# Patient Record
Sex: Male | Born: 1980 | Race: Black or African American | Hispanic: No | Marital: Single | Smoking: Current every day smoker
Health system: Southern US, Community
[De-identification: ages and names within clinical notes are randomized; demographics above are authoritative.]

---

## 2002-12-09 ENCOUNTER — Emergency Department (HOSPITAL_COMMUNITY): Admission: EM | Admit: 2002-12-09 | Discharge: 2002-12-09 | Payer: Self-pay | Admitting: Emergency Medicine

## 2006-05-07 ENCOUNTER — Emergency Department (HOSPITAL_COMMUNITY): Admission: EM | Admit: 2006-05-07 | Discharge: 2006-05-07 | Payer: Self-pay | Admitting: Family Medicine

## 2007-05-19 ENCOUNTER — Emergency Department (HOSPITAL_COMMUNITY): Admission: EM | Admit: 2007-05-19 | Discharge: 2007-05-19 | Payer: Self-pay | Admitting: Emergency Medicine

## 2007-10-17 ENCOUNTER — Emergency Department (HOSPITAL_COMMUNITY): Admission: EM | Admit: 2007-10-17 | Discharge: 2007-10-17 | Payer: Self-pay | Admitting: Emergency Medicine

## 2009-05-12 ENCOUNTER — Emergency Department (HOSPITAL_COMMUNITY): Admission: EM | Admit: 2009-05-12 | Discharge: 2009-05-12 | Payer: Self-pay | Admitting: Emergency Medicine

## 2009-10-11 ENCOUNTER — Emergency Department (HOSPITAL_COMMUNITY): Admission: EM | Admit: 2009-10-11 | Discharge: 2009-10-11 | Payer: Self-pay | Admitting: Emergency Medicine

## 2009-12-07 ENCOUNTER — Emergency Department (HOSPITAL_COMMUNITY): Admission: EM | Admit: 2009-12-07 | Discharge: 2009-12-07 | Payer: Self-pay | Admitting: Emergency Medicine

## 2011-08-16 ENCOUNTER — Encounter (HOSPITAL_COMMUNITY): Payer: Self-pay

## 2011-08-16 ENCOUNTER — Emergency Department (HOSPITAL_COMMUNITY)
Admission: EM | Admit: 2011-08-16 | Discharge: 2011-08-16 | Disposition: A | Discharge status: Home / Self Care | Payer: Self-pay | Attending: Emergency Medicine | Admitting: Emergency Medicine

## 2011-08-16 DIAGNOSIS — F17200 Nicotine dependence, unspecified, uncomplicated: Secondary | ICD-10-CM | POA: Insufficient documentation

## 2011-08-16 DIAGNOSIS — K0889 Other specified disorders of teeth and supporting structures: Secondary | ICD-10-CM

## 2011-08-16 DIAGNOSIS — K029 Dental caries, unspecified: Secondary | ICD-10-CM | POA: Insufficient documentation

## 2011-08-16 MED ORDER — HYDROCODONE-ACETAMINOPHEN 5-325 MG PO TABS: 2.0000 | ORAL_TABLET | ORAL | Status: AC | PRN

## 2011-08-16 MED ORDER — AMOXICILLIN 500 MG PO CAPS: 500.0000 mg | ORAL_CAPSULE | Freq: Three times a day (TID) | ORAL | Status: AC

## 2011-08-16 NOTE — ED Provider Notes (Signed)
History     CSN: [id number]  Arrival date & time 08/16/11  1203   First MD Initiated Contact with Patient 08/16/11 1222      Chief Complaint  Patient presents with  . Dental Pain    (Consider location/radiation/quality/duration/timing/severity/associated sxs/prior treatment) Patient is a 31 y.o. male presenting with tooth pain. The history is provided by the patient.  Dental PainThe primary symptoms include mouth pain. Primary symptoms do not include shortness of breath or cough. The symptoms began more than 1 week ago. The symptoms are worsening. The symptoms occur frequently.  Additional symptoms include: gum swelling and jaw pain. Additional symptoms do not include: nosebleeds. Medical issues include: smoking.    History reviewed. No pertinent past medical history.  History reviewed. No pertinent past surgical history.  No family history on file.  History  Substance Use Topics  . Smoking status: Current Everyday Smoker  . Smokeless tobacco: Not on file  . Alcohol Use: No      Review of Systems  Constitutional: Negative for activity change.       All ROS Neg except as noted in HPI  HENT: Positive for dental problem. Negative for nosebleeds and neck pain.   Eyes: Negative for photophobia and discharge.  Respiratory: Negative for cough, shortness of breath and wheezing.   Cardiovascular: Negative for chest pain and palpitations.  Gastrointestinal: Negative for abdominal pain and blood in stool.  Genitourinary: Negative for dysuria, frequency and hematuria.  Musculoskeletal: Negative for back pain and arthralgias.  Skin: Negative.   Neurological: Negative for dizziness, seizures and speech difficulty.  Psychiatric/Behavioral: Negative for hallucinations and confusion.    Allergies  Review of patient's allergies indicates no known allergies.  Home Medications   Current Outpatient Rx  Name Route Sig Dispense Refill  . AMOXICILLIN 500 MG PO CAPS Oral Take 1  capsule (500 mg total) by mouth 3 (three) times daily. 21 capsule 0  . HYDROCODONE-ACETAMINOPHEN 5-325 MG PO TABS Oral Take 2 tablets by mouth every 4 (four) hours as needed for pain. 20 tablet 0    BP 127/93  Pulse 88  Temp 98.4 F (36.9 C) (Oral)  Resp 18  SpO2 100%  Physical Exam  Nursing note and vitals reviewed. Constitutional: He is oriented to person, place, and time. He appears well-developed and well-nourished.  Non-toxic appearance.  HENT:  Head: Normocephalic.  Right Ear: Tympanic membrane and external ear normal.  Left Ear: Tympanic membrane and external ear normal.       Patient has multiple dental caries. Several teeth are decayed to the gum line. There is swelling of the right lower gum area. No visible abscess.  Eyes: EOM and lids are normal. Pupils are equal, round, and reactive to light.  Neck: Normal range of motion. Neck supple. Carotid bruit is not present.  Cardiovascular: Normal rate, regular rhythm, normal heart sounds, intact distal pulses and normal pulses.   Pulmonary/Chest: Breath sounds normal. No respiratory distress.  Abdominal: Soft. Bowel sounds are normal. There is no tenderness. There is no guarding.  Musculoskeletal: Normal range of motion.  Lymphadenopathy:       Head (right side): No submandibular adenopathy present.       Head (left side): No submandibular adenopathy present.    He has no cervical adenopathy.  Neurological: He is alert and oriented to person, place, and time. He has normal strength. No cranial nerve deficit or sensory deficit.  Skin: Skin is warm and dry.  Psychiatric: He has a normal  mood and affect. His speech is normal.    ED Course  Procedures (including critical care time)  Labs Reviewed - No data to display No results found.   1. Toothache       MDM  I have reviewed nursing notes, vital signs, and all appropriate lab and imaging results for this patient. The patient has multiple dental caries present on  examination. Vital signs are stable at this time. Dental resources sheet given to the patient. Prescription for Amoxil 500 mg 3 times daily, and Norco 5 mg #20 tablets given to the patient.       Kathie Dike, [place] 08/16/11 1244

## 2011-08-16 NOTE — ED Notes (Signed)
Pt reports pain to his lower rt tooth.  Pt reports that the tooth "broke off" a week ago.  Pt reports severe pain to the tooth.  Pt reports taking ibuprofen w/out relief.

## 2011-08-16 NOTE — Discharge Instructions (Signed)
Ear exam reveals multiple sites an infected cavity areas. Please see a dentist as well as possible to prevent further infection. Use Amoxil 3 times daily with food. Please use 3 ibuprofen tablets 3 times daily. Please use Norco every 4 hours if needed for pain. This medication may cause drowsiness, please use with caution.Toothache Toothaches are usually caused by tooth decay (cavity). However, other causes of toothache include:  Gum disease.   Cracked tooth.   Cracked filling.   Injury.   Jaw problem (temporo mandibular joint or TMJ disorder).   Tooth abscess.   Root sensitivity.   Grinding.   Eruption problems.  Swelling and redness around a painful tooth often means you have a dental abscess. Pain medicine and antibiotics can help reduce symptoms, but you will need to see a dentist within the next few days to have your problem properly evaluated and treated. If tooth decay is the problem, you may need a filling or root canal to save your tooth. If the problem is more severe, your tooth may need to be pulled. SEEK IMMEDIATE MEDICAL CARE IF:  You cannot swallow.   You develop severe swelling, increased redness, or increased pain in your mouth or face.   You have a fever.   You cannot open your mouth adequately.  Document Released: 03/17/2004 Document Revised: 01/27/2011 Document Reviewed: 05/07/2009 [facility] Patient Information 2012 [place], [place].

## 2011-08-17 NOTE — ED Provider Notes (Signed)
Medical screening examination/treatment/procedure(s) were performed by non-physician practitioner and as supervising physician I was immediately available for consultation/collaboration.   Benny Lennert, MD 08/17/11 [phone]

## 2012-08-18 ENCOUNTER — Encounter (HOSPITAL_COMMUNITY): Payer: Self-pay | Admitting: *Deleted

## 2012-08-18 ENCOUNTER — Emergency Department (HOSPITAL_COMMUNITY)
Admission: EM | Admit: 2012-08-18 | Discharge: 2012-08-18 | Disposition: A | Discharge status: Home / Self Care | Payer: Self-pay | Attending: Emergency Medicine | Admitting: Emergency Medicine

## 2012-08-18 ENCOUNTER — Emergency Department (HOSPITAL_COMMUNITY): Payer: Self-pay

## 2012-08-18 DIAGNOSIS — J40 Bronchitis, not specified as acute or chronic: Secondary | ICD-10-CM | POA: Insufficient documentation

## 2012-08-18 DIAGNOSIS — R630 Anorexia: Secondary | ICD-10-CM | POA: Insufficient documentation

## 2012-08-18 DIAGNOSIS — Z87891 Personal history of nicotine dependence: Secondary | ICD-10-CM | POA: Insufficient documentation

## 2012-08-18 MED ORDER — ALBUTEROL SULFATE HFA 108 (90 BASE) MCG/ACT IN AERS
2.0000 | INHALATION_SPRAY | Freq: Once | RESPIRATORY_TRACT | Status: AC
  Administered 2012-08-18: 2 via RESPIRATORY_TRACT
  Filled 2012-08-18: qty 6.7

## 2012-08-18 MED ORDER — AEROCHAMBER PLUS W/MASK MISC
1.0000 | Freq: Once | Status: AC
  Administered 2012-08-18: 1

## 2012-08-18 MED ORDER — BENZONATATE 100 MG PO CAPS: 100.0000 mg | ORAL_CAPSULE | Freq: Three times a day (TID) | ORAL | Status: DC

## 2012-08-18 NOTE — ED Provider Notes (Signed)
History    This chart was scribed for Corey Roller, MD by Leone Payor, ED Scribe. This patient was seen in room [facility]14[facility] and the patient's care was started 2:05 PM.  CSN: [id number] Arrival date & time 08/18/12  1309  First MD Initiated Contact with Patient 08/18/12 1401     Chief Complaint  Patient presents with  . Cough    The history is provided by the patient. No language interpreter was used.    HPI Comments: Corey Snow is a 32 y.o. male who presents to the Emergency Department complaining of an ongoing, constant, unchanged cough that started 4 days ago. States initially he had a productive cough but has become dry. He reports coughing constantly all throughout the day but is worse at night. He has taken Nyquil without relief. States he has had a decreased appetite. Denies drainage, blurred vision, HA, ear pain, fever, chills, leg swelling, diarrhea, abdominal pain, dysuria. Denies h/o asthma, heart or lung troubles.   History reviewed. No pertinent past medical history. History reviewed. No pertinent past surgical history. No family history on file. History  Substance Use Topics  . Smoking status: Former Games developer  . Smokeless tobacco: Not on file  . Alcohol Use: No    Review of Systems  Allergies  Review of patient's allergies indicates no known allergies.  Home Medications   Current Outpatient Rx  Name  Route  Sig  Dispense  Refill  . Doxylamine-DM (VICKS NYQUIL COUGH) 6.25-15 MG/15ML LIQD   Oral   Take 30 mLs by mouth every 4 (four) hours as needed (cough).         . benzonatate (TESSALON) 100 MG capsule   Oral   Take 1 capsule (100 mg total) by mouth every 8 (eight) hours.   21 capsule   0    BP 124/88  Pulse 86  Temp(Src) 98.1 F (36.7 C) (Oral)  Resp 18  Ht 5\' 7"  (1.702 m)  Wt 210 lb (95.255 kg)  BMI 32.88 kg/m2  SpO2 100% Physical Exam  Nursing note and vitals reviewed. Constitutional: He is oriented to person, place, and time. He  appears well-developed and well-nourished. No distress.  No distress and speaks in full sentences.   HENT:  Head: Normocephalic and atraumatic.  Eyes: Conjunctivae and EOM are normal. Pupils are equal, round, and reactive to light.  Neck: Normal range of motion. Neck supple.  Cardiovascular: Normal rate, regular rhythm, normal heart sounds and intact distal pulses.   No murmur heard. Pulmonary/Chest: Effort normal and breath sounds normal. No respiratory distress. He has no wheezes. He has no rales. He exhibits no tenderness.  Abdominal: Soft. Bowel sounds are normal. He exhibits no distension and no mass. There is no tenderness. There is no rebound and no guarding.  Musculoskeletal: Normal range of motion. He exhibits no tenderness.  Lymphadenopathy:    He has no cervical adenopathy.  Neurological: He is alert and oriented to person, place, and time.  Skin: Skin is warm and dry.  Psychiatric: He has a normal mood and affect.    ED Course  Procedures (including critical care time)  DIAGNOSTIC STUDIES: Oxygen Saturation is 100% on RA, normal by my interpretation.    COORDINATION OF CARE: 2:04 PM Discussed treatment plan with pt at bedside and pt agreed to plan.   Labs Reviewed - No data to display Dg Chest 2 View  08/18/2012   *RADIOLOGY REPORT*  Clinical Data: Cough.  CHEST - 2 VIEW  Comparison: 10/17/2007.  Findings: The heart, mediastinum and hilar contours are normal. The lungs are clear and fully expanded.  There are no effusions or pneumothoraces.  There are no acute bony changes.  There is mild S- shaped scoliosis of the thoracic spine.  IMPRESSION: No active disease.   Original Report Authenticated By: Sander Radon, M.D.   1. Bronchitis     MDM  Pt is well appearing, normal VS, normal exam, likely mild bronchitis - tessalong, MDI, stable for d/c.  Meds given in ED:  Medications  albuterol (PROVENTIL HFA;VENTOLIN HFA) 108 (90 BASE) MCG/ACT inhaler 2 puff (not  administered)  aerochamber plus with mask device 1 each (not administered)    New Prescriptions   BENZONATATE (TESSALON) 100 MG CAPSULE    Take 1 capsule (100 mg total) by mouth every 8 (eight) hours.      I personally performed the services described in this documentation, which was scribed in my presence. The recorded information has been reviewed and is accurate.      Corey Roller, MD 08/18/12 [phone]

## 2012-08-18 NOTE — ED Notes (Signed)
Reviewed instructions with pt. 1 script given to pt. Voiced understanding. nad noted prior to dc. resp even/nonlabored.

## 2012-08-18 NOTE — ED Notes (Signed)
Pt c/o cough that is non productive, mid center chest pain that is worse with palpation and respiration that started three days ago, denies any n/v, fever.

## 2017-01-21 ENCOUNTER — Emergency Department (HOSPITAL_COMMUNITY): Payer: Self-pay

## 2017-01-21 ENCOUNTER — Other Ambulatory Visit: Payer: Self-pay

## 2017-01-21 ENCOUNTER — Encounter (HOSPITAL_COMMUNITY): Payer: Self-pay | Admitting: Nurse Practitioner

## 2017-01-21 ENCOUNTER — Emergency Department (HOSPITAL_COMMUNITY)
Admission: EM | Admit: 2017-01-21 | Discharge: 2017-01-21 | Disposition: A | Discharge status: Home / Self Care | Payer: Self-pay | Attending: Emergency Medicine | Admitting: Emergency Medicine

## 2017-01-21 DIAGNOSIS — Y9389 Activity, other specified: Secondary | ICD-10-CM | POA: Insufficient documentation

## 2017-01-21 DIAGNOSIS — Z87891 Personal history of nicotine dependence: Secondary | ICD-10-CM | POA: Insufficient documentation

## 2017-01-21 DIAGNOSIS — X58XXXA Exposure to other specified factors, initial encounter: Secondary | ICD-10-CM | POA: Insufficient documentation

## 2017-01-21 DIAGNOSIS — Y929 Unspecified place or not applicable: Secondary | ICD-10-CM | POA: Insufficient documentation

## 2017-01-21 DIAGNOSIS — M6789 Other specified disorders of synovium and tendon, multiple sites: Secondary | ICD-10-CM | POA: Insufficient documentation

## 2017-01-21 DIAGNOSIS — Y999 Unspecified external cause status: Secondary | ICD-10-CM | POA: Insufficient documentation

## 2017-01-21 DIAGNOSIS — S62607A Fracture of unspecified phalanx of left little finger, initial encounter for closed fracture: Secondary | ICD-10-CM | POA: Insufficient documentation

## 2017-01-21 DIAGNOSIS — Z79899 Other long term (current) drug therapy: Secondary | ICD-10-CM | POA: Insufficient documentation

## 2017-01-21 MED ORDER — HYDROCODONE-ACETAMINOPHEN 5-325 MG PO TABS
1.0000 | ORAL_TABLET | Freq: Once | ORAL | Status: AC
  Administered 2017-01-21: 1 via ORAL
  Filled 2017-01-21: qty 1

## 2017-01-21 MED ORDER — NAPROXEN 500 MG PO TABS: 500.0000 mg | ORAL_TABLET | Freq: Two times a day (BID) | ORAL | 0 refills | Status: DC

## 2017-01-21 NOTE — ED Triage Notes (Signed)
Patient reports hitting left pinky the day before Thanksgiving. Reports taking ibuprofen for the pain and discomfort however, it has become harder to move pinky over the past week and has noticed an increase in swelling. Denies any other pain or discomfort.

## 2017-01-21 NOTE — ED Provider Notes (Signed)
[facility]-EMERGENCY DEPT Provider Note   CSN: [id number] Arrival date & time: 01/21/17  1147     History   Chief Complaint Chief Complaint  Patient presents with  . Finger Injury    HPI Corey CrouchWilliam J Snow is a 36 y.o. male who presents to the ED with finger pain. The pain is located in the left little finger. Patient reports that he hit his pinky finger the day before Thanksgiving and it has continue to hurt and swell. The pain has increased over the past week patient denies any other problems.   HPI  History reviewed. No pertinent past medical history.  There are no active problems to display for this patient.   History reviewed. No pertinent surgical history.     Home Medications    Prior to Admission medications   Medication Sig Start Date End Date Taking? Authorizing Provider  benzonatate (TESSALON) 100 MG capsule Take 1 capsule (100 mg total) by mouth every 8 (eight) hours. 08/18/12   Eber HongMiller, Brian, MD  Doxylamine-DM (VICKS NYQUIL COUGH) 6.25-15 MG/15ML LIQD Take 30 mLs by mouth every 4 (four) hours as needed (cough).    [provider]  naproxen (NAPROSYN) 500 MG tablet Take 1 tablet (500 mg total) by mouth 2 (two) times daily. 01/21/17   Janne NapoleonNeese, Lakenzie Mcclafferty M, NP    Family History History reviewed. No pertinent family history.  Social History Social History   Tobacco Use  . Smoking status: Former Games developermoker  . Smokeless tobacco: Never Used  Substance Use Topics  . Alcohol use: No  . Drug use: No     Allergies   Patient has no known allergies.   Review of Systems Review of Systems  Musculoskeletal: Positive for arthralgias.  Skin: Negative for wound.  All other systems reviewed and are negative.    Physical Exam Updated Vital Signs BP (!) 147/99 (BP Location: Right Arm)   Pulse 77   Temp 98.4 F (36.9 C) (Oral)   Resp 14   Ht 5\' 7"  (1.702 m)   Wt 97.5 kg (215 lb)   SpO2 99%   BMI 33.67 kg/m   Physical Exam    Constitutional: He appears well-developed and well-nourished. No distress.  HENT:  Head: Normocephalic.  Eyes: EOM are normal.  Neck: Neck supple.  Cardiovascular: Normal rate.  Pulmonary/Chest: Effort normal.  Musculoskeletal:       Left hand: He exhibits decreased range of motion, tenderness and deformity. He exhibits normal capillary refill and no laceration. Swelling: minimal. Decreased strength (little finger) noted. He exhibits no thumb/finger opposition.  The left little finger is slightly swollen and tender with passive range of motion. Patient is unable to extend the little finger and there is decreased strength.   Neurological: He is alert.  Skin: Skin is warm and dry.  Psychiatric: He has a normal mood and affect. His behavior is normal.  Nursing note and vitals reviewed.    ED Treatments / Results  Labs (all labs ordered are listed, but only abnormal results are displayed) Labs Reviewed - No data to display  Radiology Dg Finger Little Left  Result Date: 01/21/2017 CLINICAL DATA:  Injury, persistent pain EXAM: LEFT LITTLE FINGER 2+V COMPARISON:  None. FINDINGS: There an acute displaced and angulated fracture of the left fifth metacarpal just proximal to the fifth MTP joint. Mild overlying soft tissue swelling. No associated subluxation or dislocation. Fifth metacarpal fracture does not involve the articular surface. IMPRESSION: Acute minimally displaced and angulated left fifth  distal metacarpal fracture. Electronically Signed   By: Judie PetitM.  Shick M.D.   On: 01/21/2017 13:33    Procedures Procedures (including critical care time)  Medications Ordered in ED Medications  HYDROcodone-acetaminophen (NORCO/VICODIN) 5-325 MG per tablet 1 tablet (not administered)     Initial Impression / Assessment and Plan / ED Course  I have reviewed the triage vital signs and the nursing notes.  Consult with Dr. Mina MarbleWeingold, will apply splint and the office will call to schedule a follow up  appointment.   Clinical Course as of Jan 21 1529  Sat Jan 21, 2017  1421 DG Finger Little Left [RL]  1421 DG Finger Little Left [RL]    Clinical Course User Index [RL] Corey Snow, Robert, MD  36 y.o. male with who is left hand dominate with left little finger pain and unable to extend his finger.  [name] with fracture of the left little finger. Pain managed in ED. Pt advised to follow up with orthopedics for further evaluation and treatment. Conservative therapy recommended and discussed. Patient will be dc home & is agreeable with above plan. I have also discussed reasons to return immediately to the ER.  Patient expresses understanding and agrees with plan.   Final Clinical Impressions(s) / ED Diagnoses   Final diagnoses:  Closed nondisplaced fracture of phalanx of left little finger, unspecified phalanx, initial encounter  Extensor tendon disruption    ED Discharge Orders        Ordered    naproxen (NAPROSYN) 500 MG tablet  2 times daily     01/21/17 1529       Kerrie Buffaloeese, Tikita Mabee MilanM, [place]NP 01/21/17 1536    Alvira MondaySchlossman, Erin, MD 01/22/17 [phone]

## 2017-01-21 NOTE — Discharge Instructions (Signed)
Someone from Dr. Ronie SpiesWeingold's office will call you Monday to schedule follow up. Wear the splint and take ibuprofen as needed for pain.

## 2017-01-24 ENCOUNTER — Other Ambulatory Visit: Payer: Self-pay | Admitting: Orthopedic Surgery

## 2017-01-25 ENCOUNTER — Encounter (HOSPITAL_BASED_OUTPATIENT_CLINIC_OR_DEPARTMENT_OTHER): Payer: Self-pay | Admitting: *Deleted

## 2017-01-25 ENCOUNTER — Other Ambulatory Visit: Payer: Self-pay

## 2017-01-27 ENCOUNTER — Other Ambulatory Visit: Payer: Self-pay

## 2017-01-27 ENCOUNTER — Ambulatory Visit (HOSPITAL_BASED_OUTPATIENT_CLINIC_OR_DEPARTMENT_OTHER)
Admission: RE | Admit: 2017-01-27 | Discharge: 2017-01-27 | Disposition: A | Discharge status: Home / Self Care | Payer: Self-pay | Source: Ambulatory Visit | Attending: Orthopedic Surgery | Admitting: Orthopedic Surgery

## 2017-01-27 ENCOUNTER — Encounter (HOSPITAL_BASED_OUTPATIENT_CLINIC_OR_DEPARTMENT_OTHER)
Admission: RE | Disposition: A | Discharge status: Home / Self Care | Payer: Self-pay | Source: Ambulatory Visit | Attending: Orthopedic Surgery

## 2017-01-27 ENCOUNTER — Ambulatory Visit (HOSPITAL_BASED_OUTPATIENT_CLINIC_OR_DEPARTMENT_OTHER): Payer: Self-pay | Admitting: Anesthesiology

## 2017-01-27 ENCOUNTER — Encounter (HOSPITAL_BASED_OUTPATIENT_CLINIC_OR_DEPARTMENT_OTHER): Payer: Self-pay | Admitting: *Deleted

## 2017-01-27 DIAGNOSIS — S62307A Unspecified fracture of fifth metacarpal bone, left hand, initial encounter for closed fracture: Secondary | ICD-10-CM | POA: Insufficient documentation

## 2017-01-27 DIAGNOSIS — F17210 Nicotine dependence, cigarettes, uncomplicated: Secondary | ICD-10-CM | POA: Insufficient documentation

## 2017-01-27 HISTORY — PX: OPEN REDUCTION INTERNAL FIXATION (ORIF) METACARPAL: SHX6234

## 2017-01-27 SURGERY — OPEN REDUCTION INTERNAL FIXATION (ORIF) METACARPAL
Anesthesia: Regional | Site: Hand | Laterality: Left

## 2017-01-27 MED ORDER — FENTANYL CITRATE (PF) 100 MCG/2ML IJ SOLN
INTRAMUSCULAR | Status: AC
  Filled 2017-01-27: qty 2

## 2017-01-27 MED ORDER — HYDROMORPHONE HCL 1 MG/ML IJ SOLN: 0.2500 mg | INTRAMUSCULAR | Status: DC | PRN

## 2017-01-27 MED ORDER — SCOPOLAMINE 1 MG/3DAYS TD PT72: 1.0000 | MEDICATED_PATCH | Freq: Once | TRANSDERMAL | Status: DC | PRN

## 2017-01-27 MED ORDER — ONDANSETRON HCL 4 MG/2ML IJ SOLN
4.0000 mg | Freq: Once | INTRAMUSCULAR | Status: DC | PRN
Start: 2017-01-27 — End: 2017-01-27

## 2017-01-27 MED ORDER — DEXAMETHASONE SODIUM PHOSPHATE 10 MG/ML IJ SOLN
INTRAMUSCULAR | Status: AC
  Filled 2017-01-27: qty 1

## 2017-01-27 MED ORDER — CEFAZOLIN SODIUM-DEXTROSE 2-4 GM/100ML-% IV SOLN
2.0000 g | INTRAVENOUS | Status: AC
  Administered 2017-01-27: 2 g via INTRAVENOUS

## 2017-01-27 MED ORDER — LIDOCAINE 2% (20 MG/ML) 5 ML SYRINGE
INTRAMUSCULAR | Status: DC | PRN
  Administered 2017-01-27: 40 mg via INTRAVENOUS

## 2017-01-27 MED ORDER — PROPOFOL 500 MG/50ML IV EMUL
INTRAVENOUS | Status: AC
  Filled 2017-01-27: qty 50

## 2017-01-27 MED ORDER — MEPERIDINE HCL 25 MG/ML IJ SOLN: 6.2500 mg | INTRAMUSCULAR | Status: DC | PRN

## 2017-01-27 MED ORDER — BUPIVACAINE HCL (PF) 0.25 % IJ SOLN
INTRAMUSCULAR | Status: AC
  Filled 2017-01-27: qty 60

## 2017-01-27 MED ORDER — LIDOCAINE 2% (20 MG/ML) 5 ML SYRINGE
INTRAMUSCULAR | Status: AC
  Filled 2017-01-27: qty 5

## 2017-01-27 MED ORDER — PROPOFOL 500 MG/50ML IV EMUL
INTRAVENOUS | Status: DC | PRN
  Administered 2017-01-27: 100 ug/kg/min via INTRAVENOUS

## 2017-01-27 MED ORDER — GLYCOPYRROLATE 0.2 MG/ML IJ SOLN
INTRAMUSCULAR | Status: DC | PRN
  Administered 2017-01-27: 0.2 mg via INTRAVENOUS

## 2017-01-27 MED ORDER — MIDAZOLAM HCL 2 MG/2ML IJ SOLN
1.0000 mg | INTRAMUSCULAR | Status: DC | PRN
  Administered 2017-01-27: 1 mg via INTRAVENOUS
  Administered 2017-01-27: 2 mg via INTRAVENOUS

## 2017-01-27 MED ORDER — ONDANSETRON HCL 4 MG/2ML IJ SOLN
INTRAMUSCULAR | Status: AC
  Filled 2017-01-27: qty 2

## 2017-01-27 MED ORDER — CEFAZOLIN SODIUM-DEXTROSE 2-4 GM/100ML-% IV SOLN
INTRAVENOUS | Status: AC
Start: 2017-01-27 — End: 2017-01-27
  Filled 2017-01-27: qty 100

## 2017-01-27 MED ORDER — LACTATED RINGERS IV SOLN
INTRAVENOUS | Status: DC
  Administered 2017-01-27 (×2): via INTRAVENOUS

## 2017-01-27 MED ORDER — CHLORHEXIDINE GLUCONATE 4 % EX LIQD: 60.0000 mL | Freq: Once | CUTANEOUS | Status: DC

## 2017-01-27 MED ORDER — MIDAZOLAM HCL 2 MG/2ML IJ SOLN
INTRAMUSCULAR | Status: AC
  Filled 2017-01-27: qty 2

## 2017-01-27 MED ORDER — BUPIVACAINE-EPINEPHRINE (PF) 0.25% -1:200000 IJ SOLN
INTRAMUSCULAR | Status: AC
  Filled 2017-01-27: qty 30

## 2017-01-27 MED ORDER — PROPOFOL 10 MG/ML IV BOLUS
INTRAVENOUS | Status: AC
  Filled 2017-01-27: qty 20

## 2017-01-27 MED ORDER — OXYCODONE-ACETAMINOPHEN 5-325 MG PO TABS: 1.0000 | ORAL_TABLET | Freq: Four times a day (QID) | ORAL | 0 refills | Status: AC | PRN

## 2017-01-27 MED ORDER — FENTANYL CITRATE (PF) 100 MCG/2ML IJ SOLN
50.0000 ug | INTRAMUSCULAR | Status: DC | PRN
  Administered 2017-01-27: 100 ug via INTRAVENOUS
  Administered 2017-01-27: 50 ug via INTRAVENOUS

## 2017-01-27 SURGICAL SUPPLY — 68 items
APL SKNCLS STERI-STRIP NONHPOA (GAUZE/BANDAGES/DRESSINGS) ×1
BANDAGE ACE 3X5.8 VEL STRL LF (GAUZE/BANDAGES/DRESSINGS) IMPLANT
BANDAGE ACE 4X5 VEL STRL LF (GAUZE/BANDAGES/DRESSINGS) ×2 IMPLANT
BENZOIN TINCTURE PRP APPL 2/3 (GAUZE/BANDAGES/DRESSINGS) ×2 IMPLANT
BIT DRILL 1.5 (BIT) ×1
BIT DRILL 1.5MM (BIT) IMPLANT
BLADE SURG 15 STRL LF DISP TIS (BLADE) ×1 IMPLANT
BLADE SURG 15 STRL SS (BLADE) ×2
BNDG CMPR 9X4 STRL LF SNTH (GAUZE/BANDAGES/DRESSINGS) ×1
BNDG ELASTIC 2X5.8 VLCR STR LF (GAUZE/BANDAGES/DRESSINGS) IMPLANT
BNDG ESMARK 4X9 LF (GAUZE/BANDAGES/DRESSINGS) ×2 IMPLANT
BNDG GAUZE ELAST 4 BULKY (GAUZE/BANDAGES/DRESSINGS) ×2 IMPLANT
CANISTER SUCT 1200ML W/VALVE (MISCELLANEOUS) IMPLANT
CORD BIPOLAR FORCEPS 12FT (ELECTRODE) ×2 IMPLANT
COVER BACK TABLE 60X90IN (DRAPES) ×2 IMPLANT
CUFF TOURNIQUET SINGLE 18IN (TOURNIQUET CUFF) ×2 IMPLANT
DECANTER SPIKE VIAL GLASS SM (MISCELLANEOUS) IMPLANT
DRAPE EXTREMITY T 121X128X90 (DRAPE) ×2 IMPLANT
DRAPE OEC MINIVIEW 54X84 (DRAPES) ×2 IMPLANT
DRAPE SURG 17X23 STRL (DRAPES) ×2 IMPLANT
DRILL BIT 1.5MM (BIT) ×2
DURAPREP 26ML APPLICATOR (WOUND CARE) ×2 IMPLANT
GAUZE SPONGE 4X4 12PLY STRL (GAUZE/BANDAGES/DRESSINGS) ×2 IMPLANT
GAUZE SPONGE 4X4 16PLY XRAY LF (GAUZE/BANDAGES/DRESSINGS) IMPLANT
GAUZE XEROFORM 1X8 LF (GAUZE/BANDAGES/DRESSINGS) IMPLANT
GLOVE SURG SYN 8.0 (GLOVE) ×2 IMPLANT
GLOVE SURG SYN 8.0 PF PI (GLOVE) ×1 IMPLANT
GOWN STRL REUS W/ TWL LRG LVL3 (GOWN DISPOSABLE) ×1 IMPLANT
GOWN STRL REUS W/TWL LRG LVL3 (GOWN DISPOSABLE) ×2
GOWN STRL REUS W/TWL XL LVL3 (GOWN DISPOSABLE) ×4 IMPLANT
NDL HYPO 25X1 1.5 SAFETY (NEEDLE) IMPLANT
NEEDLE HYPO 25X1 1.5 SAFETY (NEEDLE) IMPLANT
NS IRRIG 1000ML POUR BTL (IV SOLUTION) IMPLANT
PACK BASIN DAY SURGERY FS (CUSTOM PROCEDURE TRAY) ×2 IMPLANT
PAD CAST 3X4 CTTN HI CHSV (CAST SUPPLIES) ×1 IMPLANT
PAD CAST 4YDX4 CTTN HI CHSV (CAST SUPPLIES) IMPLANT
PADDING CAST ABS 4INX4YD NS (CAST SUPPLIES) ×1
PADDING CAST ABS COTTON 4X4 ST (CAST SUPPLIES) ×1 IMPLANT
PADDING CAST COTTON 3X4 STRL (CAST SUPPLIES) ×2
PADDING CAST COTTON 4X4 STRL (CAST SUPPLIES)
PADDING UNDERCAST 2 STRL (CAST SUPPLIES) ×1
PADDING UNDERCAST 2X4 STRL (CAST SUPPLIES) ×1 IMPLANT
PLATE T 1.5 3H HD/8H SFT (Plate) IMPLANT
PLATE-T 1.5 3H HD/8H SFT (Plate) ×2 IMPLANT
SCREW CORTEX 1.5X10 (Screw) ×2 IMPLANT
SCREW CORTEX 1.5X12 (Screw) ×1 IMPLANT
SCREW CORTEX 1.5X14 (Screw) ×1 IMPLANT
SCREW CORTEX 1.5X18 (Screw) ×2 IMPLANT
SCREW CORTEX 1.5X9 (Screw) ×1 IMPLANT
SHEET MEDIUM DRAPE 40X70 STRL (DRAPES) ×2 IMPLANT
SLEEVE SCD COMPRESS KNEE MED (MISCELLANEOUS) ×2 IMPLANT
SPLINT PLASTER CAST XFAST 4X15 (CAST SUPPLIES) IMPLANT
SPLINT PLASTER XTRA FAST SET 4 (CAST SUPPLIES)
STOCKINETTE 4X48 STRL (DRAPES) ×2 IMPLANT
STRIP CLOSURE SKIN 1/2X4 (GAUZE/BANDAGES/DRESSINGS) ×2 IMPLANT
SUCTION FRAZIER HANDLE 10FR (MISCELLANEOUS)
SUCTION TUBE FRAZIER 10FR DISP (MISCELLANEOUS) IMPLANT
SUT MERSILENE 4 0 P 3 (SUTURE) IMPLANT
SUT PROLENE 3 0 PS 2 (SUTURE) IMPLANT
SUT VIC AB 4-0 P-3 18XBRD (SUTURE) IMPLANT
SUT VIC AB 4-0 P3 18 (SUTURE)
SUT VICRYL 4-0 PS2 18IN ABS (SUTURE) ×2 IMPLANT
SUT VICRYL+ 3-0 27IN RB-1 (SUTURE) IMPLANT
SYR 10ML LL (SYRINGE) IMPLANT
SYR BULB 3OZ (MISCELLANEOUS) IMPLANT
TOWEL OR 17X24 6PK STRL BLUE (TOWEL DISPOSABLE) ×2 IMPLANT
TUBE CONNECTING 20X1/4 (TUBING) IMPLANT
UNDERPAD 30X30 (UNDERPADS AND DIAPERS) ×2 IMPLANT

## 2017-01-27 NOTE — H&P (Signed)
Corey Snow is an 36 y.o. male.   Chief Complaint: Left small finger pain and deformity. HPI: Patient is a very pleasant 36 year old left-hand-dominant male who sustained hand trauma approximately 2 weeks ago with a painful and swollen left small metacarpal with rotational deformity and radiographs that reveal displaced and angulated fracture at the distal aspect of the left small metacarpal.  History reviewed. No pertinent past medical history.  History reviewed. No pertinent surgical history.  History reviewed. No pertinent family history. Social History:  reports that he has been smoking cigarettes.  He has been smoking about 0.25 packs per day. he has never used smokeless tobacco. He reports that he drinks alcohol. He reports that he uses drugs. Drug: Marijuana.  Allergies: No Known Allergies  Medications Prior to Admission  Medication Sig Dispense Refill  . ibuprofen (ADVIL,MOTRIN) 200 MG tablet Take 200 mg by mouth every 6 (six) hours as needed.    . vitamin C (ASCORBIC ACID) 500 MG tablet Take 500 mg by mouth daily.      No results found for this or any previous visit (from the past 48 hour(s)). No results found.  Review of Systems  All other systems reviewed and are negative.   Blood pressure (!) 130/91, pulse 87, temperature 98.2 F (36.8 C), temperature source Oral, resp. rate 18, height 5\' 7"  (1.702 m), weight 100.9 kg (222 lb 6.4 oz), SpO2 97 %. Physical Exam  Constitutional: He is oriented to person, place, and time. He appears well-developed and well-nourished.  HENT:  Head: Normocephalic and atraumatic.  Neck: Normal range of motion.  Cardiovascular: Normal rate.  Respiratory: Effort normal.  Musculoskeletal:       Left hand: He exhibits decreased range of motion, bony tenderness, deformity and swelling.  Left hand pain, swelling, and rotational deformity to small finger  Neurological: He is alert and oriented to person, place, and time.  Skin: Skin is warm.   Psychiatric: He has a normal mood and affect. His behavior is normal. Judgment and thought content normal.     Assessment/Plan [age] year old left-hand-dominant male with displaced fracture small metacarpal dominant left hand with rotational deformity. We have discussed operative fixation of this fracture as an outpatient patient understands risks and benefits and wished to proceed  Marlowe ShoresMatthew A Larenzo Caples, MD 01/27/2017, 7:20 AM

## 2017-01-27 NOTE — Op Note (Signed)
Please see dictated report #[id number]

## 2017-01-27 NOTE — Anesthesia Postprocedure Evaluation (Signed)
Anesthesia Post Note  Patient: Corey CrouchWilliam J Snow  Procedure(s) Performed: OPEN REDUCTION INTERNAL FIXATION (ORIF) LEFT SMALL  METACARPAL (Left Hand)     Patient location during evaluation: PACU Anesthesia Type: Regional Level of consciousness: awake and alert and patient cooperative Pain management: pain level controlled Vital Signs Assessment: post-procedure vital signs reviewed and stable Respiratory status: spontaneous breathing and respiratory function stable Cardiovascular status: stable Anesthetic complications: no    Last Vitals:  Vitals:   01/27/17 0900 01/27/17 0913  BP: 113/78 121/90  Pulse: 69 66  Resp: 19 16  Temp:  36.7 C  SpO2: 96% 100%    Last Pain:  Vitals:   01/27/17 0913  TempSrc: Oral  PainSc: 0-No pain                 Verlena Marlette DAVID

## 2017-01-27 NOTE — Anesthesia Procedure Notes (Addendum)
Anesthesia Regional Block: Supraclavicular block   Pre-Anesthetic Checklist: ,, timeout performed, Correct Patient, Correct Site, Correct Laterality, Correct Procedure, Correct Position, site marked, Risks and benefits discussed,  Surgical consent,  Pre-op evaluation,  At surgeon's request and post-op pain management  Laterality: Left  Prep: chloraprep       Needles:  Injection technique: Single-shot  Needle Type: Echogenic Stimulator Needle     Needle Length: 9cm  Needle Gauge: 21     Additional Needles:   Procedures:, nerve stimulator,,,,,,,   Nerve Stimulator or Paresthesia:  Response: 0.4 mA,   Additional Responses:   Narrative:  Start time: 01/27/2017 7:10 AM End time: 01/27/2017 7:20 AM Injection made incrementally with aspirations every 5 mL.  Performed by: Personally  Anesthesiologist: Arta Brucessey, Deandra Gadson, MD  Additional Notes: Monitors applied. Patient sedated. Sterile prep and drape,hand hygiene and sterile gloves were used. Relevant anatomy identified.Needle position confirmed.Local anesthetic injected incrementally after negative aspiration. Local anesthetic spread visualized around nerve(s). Vascular puncture avoided. No complications. Image printed for medical record.The patient tolerated the procedure well.

## 2017-01-27 NOTE — Anesthesia Preprocedure Evaluation (Signed)
Anesthesia Evaluation  Patient identified by MRN, date of birth, ID band Patient awake    Reviewed: Allergy & Precautions, NPO status , Patient's Chart, lab work & pertinent test results  Airway Mallampati: I  TM Distance: >3 FB Neck ROM: Full    Dental   Pulmonary Current Smoker,    Pulmonary exam normal        Cardiovascular Normal cardiovascular exam     Neuro/Psych    GI/Hepatic   Endo/Other    Renal/GU      Musculoskeletal   Abdominal   Peds  Hematology   Anesthesia Other Findings   Reproductive/Obstetrics                             Anesthesia Physical Anesthesia Plan  ASA: II  Anesthesia Plan: Regional and MAC   Post-op Pain Management:    Induction: Intravenous  PONV Risk Score and Plan: 0 and Ondansetron and Treatment may vary due to age or medical condition  Airway Management Planned: Simple Face Mask  Additional Equipment:   Intra-op Plan:   Post-operative Plan:   Informed Consent: I have reviewed the patients History and Physical, chart, labs and discussed the procedure including the risks, benefits and alternatives for the proposed anesthesia with the patient or authorized representative who has indicated his/her understanding and acceptance.     Plan Discussed with: CRNA and Surgeon  Anesthesia Plan Comments:         Anesthesia Quick Evaluation

## 2017-01-27 NOTE — Transfer of Care (Signed)
Immediate Anesthesia Transfer of Care Note  Patient: Corey Snow  Procedure(s) Performed: OPEN REDUCTION INTERNAL FIXATION (ORIF) LEFT SMALL  METACARPAL (Left Hand)  Patient Location: PACU  Anesthesia Type:MAC and MAC combined with regional for post-op pain  Level of Consciousness: awake and alert   Airway & Oxygen Therapy: Patient Spontanous Breathing and Patient connected to face mask oxygen  Post-op Assessment: Report given to RN and Post -op Vital signs reviewed and stable  Post vital signs: Reviewed and stable  Last Vitals:  Vitals:   01/27/17 0723 01/27/17 0724  BP:    Pulse: 89 89  Resp: (!) 22 (!) 23  Temp:    SpO2: 100% 100%    Last Pain:  Vitals:   01/27/17 0631  TempSrc: Oral         Complications: No apparent anesthesia complications

## 2017-01-27 NOTE — Progress Notes (Signed)
Assisted Dr. Ossey with left, ultrasound guided, supraclavicular block. Side rails up, monitors on throughout procedure. See vital signs in flow sheet. Tolerated Procedure well. 

## 2017-01-27 NOTE — Discharge Instructions (Signed)

## 2017-01-27 NOTE — Anesthesia Procedure Notes (Signed)
Procedure Name: MAC Date/Time: 01/27/2017 7:46 AM Performed by: Lieutenant Diego, CRNA Pre-anesthesia Checklist: Patient identified, Timeout performed, Emergency Drugs available, Patient being monitored and Suction available Patient Re-evaluated:Patient Re-evaluated prior to induction Oxygen Delivery Method: Simple face mask Preoxygenation: Pre-oxygenation with 100% oxygen Induction Type: IV induction

## 2017-01-29 NOTE — Op Note (Signed)
NAME:  Corey Snow, Corey Snow                   ACCOUNT NO.:  MEDICAL RECORD NO.:  [id number]  LOCATION:                                 FACILITY:  PHYSICIAN:  Artist PaisMatthew A. Mina MarbleWeingold, M.D.   DATE OF BIRTH:  DATE OF PROCEDURE:  01/27/2017 DATE OF DISCHARGE:                              OPERATIVE REPORT   PREOPERATIVE DIAGNOSIS:  Displaced left small finger metacarpal fracture.  POSTOPERATIVE DIAGNOSIS:  Displaced left small finger metacarpal fracture.  PROCEDURE:  Open reduction internal fixation of above fracture using 1.5 mm T-plate and screws.  SURGEON:  Artist PaisMatthew A. Mina MarbleWeingold, M.D.  ASSISTANT:  Marveen Reeksobert J. Dasnoit, PA.  ANESTHESIA:  Ax block and sedation.  COMPLICATION:  No complications.  DRAINS:  No drains.  DESCRIPTION OF PROCEDURE:  The patient was taken to the operating suite after induction of adequate axillary block analgesia and IV sedation. Left upper extremity was prepped and draped in sterile fashion.  An Esmarch was used to exsanguinate the limb.  Tourniquet was inflated to 250 mmHg.  At this point in time, an incision was made over the dorsal aspect of the small finger, left hand.  The skin was incised sharply 3 to 4 cm.  Dissection was carried down the interval between the extensor digitorum communis and the extensor digitorum minimi of the small finger.  We split this and dissected down to the periosteum and capsule. We split the periosteum exposing angulated and displaced fracture of the distal aspect of the metacarpal.  We debrided the fracture site of clot. Reduction was performed with longitudinal traction and downward pressure on the proximal fragment.  We then placed a 7-hole T-plate across the fracture site with 3 cortical holes distal and 4 proximal under fluoroscopic and direct imaging.  We placed the appropriate cortical screws.  The wound was irrigated.  Fluoroscopic imaging revealed adequate reduction in both the AP, lateral, and oblique views.   We closed the periosteum over the plate using 3-0 Vicryl and the skin with a 3-0 Prolene subcuticular stitch.  Steri-Strips, 4x4s, fluffs, and an ulnar gutter splint was applied.  The patient tolerated this procedure well, went to recovery room in stable fashion.    Artist PaisMatthew A. Mina MarbleWeingold, M.D.    MAW/MEDQ  D:  01/27/2017  T:  01/27/2017  Job:  [id number]

## 2017-01-30 ENCOUNTER — Encounter (HOSPITAL_BASED_OUTPATIENT_CLINIC_OR_DEPARTMENT_OTHER): Payer: Self-pay | Admitting: Orthopedic Surgery

## 2017-01-31 DIAGNOSIS — M25642 Stiffness of left hand, not elsewhere classified: Secondary | ICD-10-CM | POA: Insufficient documentation

## 2017-01-31 DIAGNOSIS — M79642 Pain in left hand: Secondary | ICD-10-CM | POA: Insufficient documentation

## 2017-01-31 DIAGNOSIS — S62337A Displaced fracture of neck of fifth metacarpal bone, left hand, initial encounter for closed fracture: Secondary | ICD-10-CM | POA: Insufficient documentation

## 2017-02-01 ENCOUNTER — Encounter (HOSPITAL_BASED_OUTPATIENT_CLINIC_OR_DEPARTMENT_OTHER): Payer: Self-pay | Admitting: Orthopedic Surgery

## 2018-08-26 IMAGING — CR DG FINGER LITTLE 2+V*L*
4 series · 4 of 4 positions shown · non-contrast
Comparison: None.

CLINICAL DATA: Injury, persistent pain

EXAM:
LEFT LITTLE FINGER 2+V

[x finger pa left (1 of 2)]
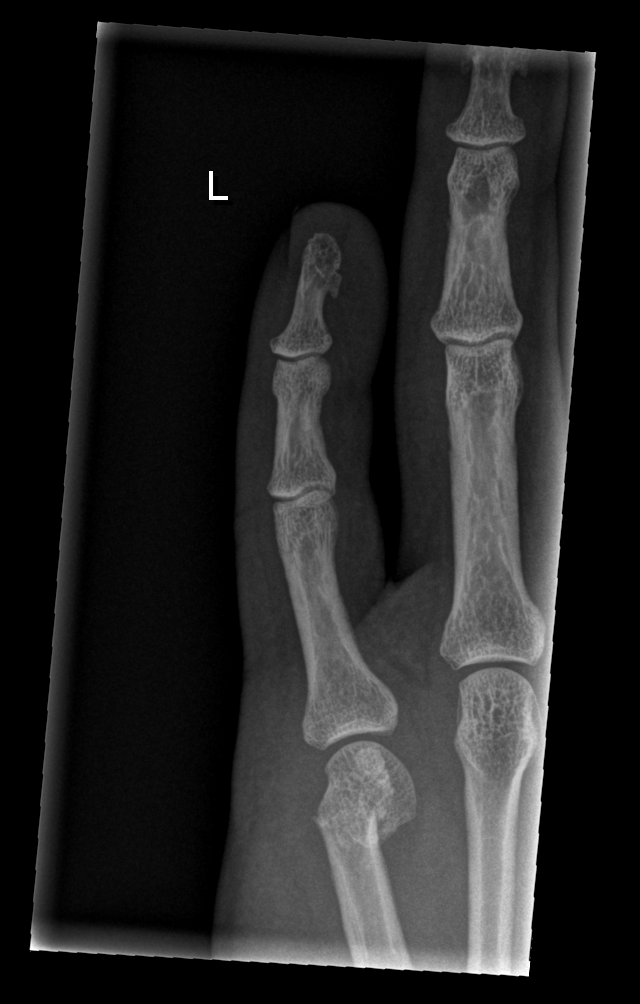

[x finger obl left]
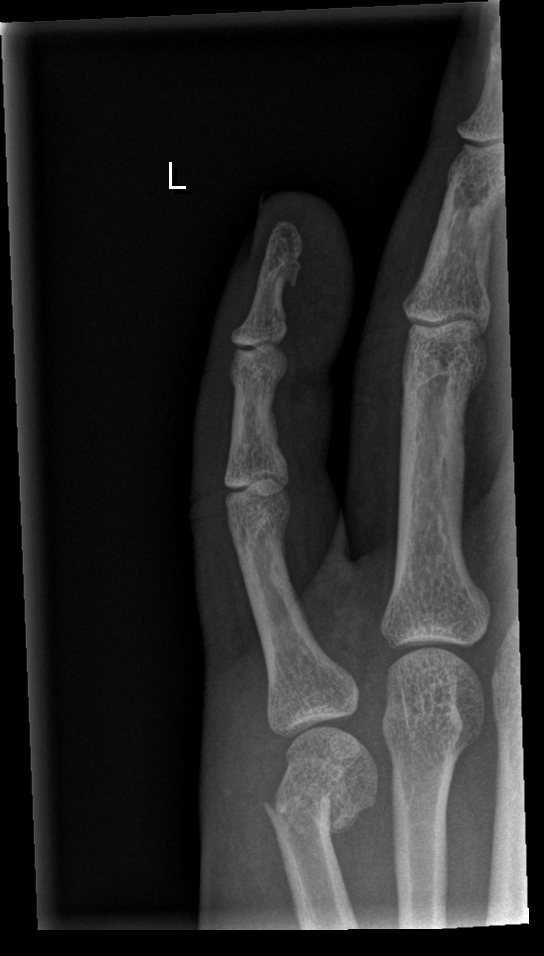

[x finger pa left (2 of 2)]
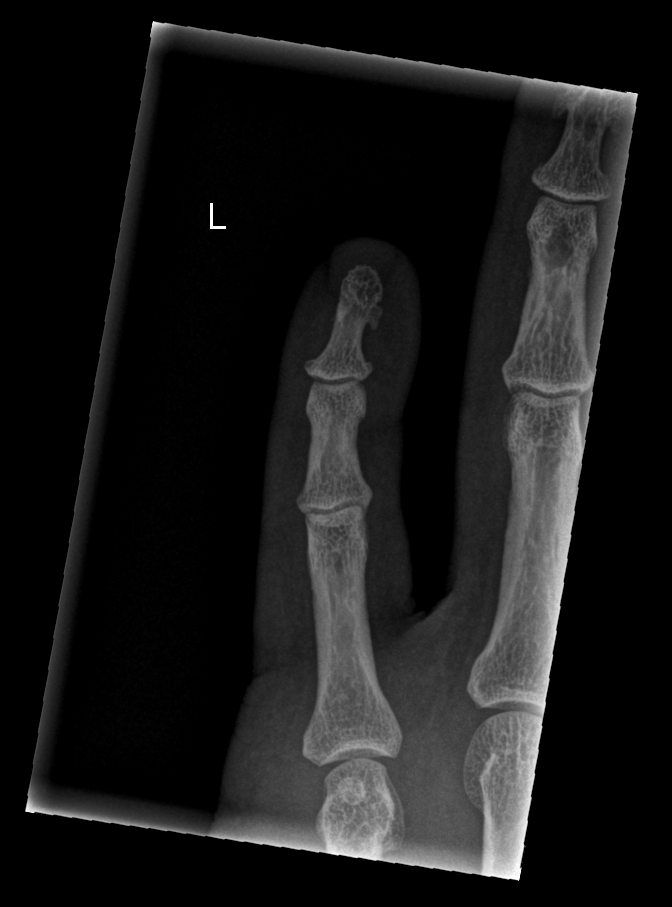

[x finger lat left]
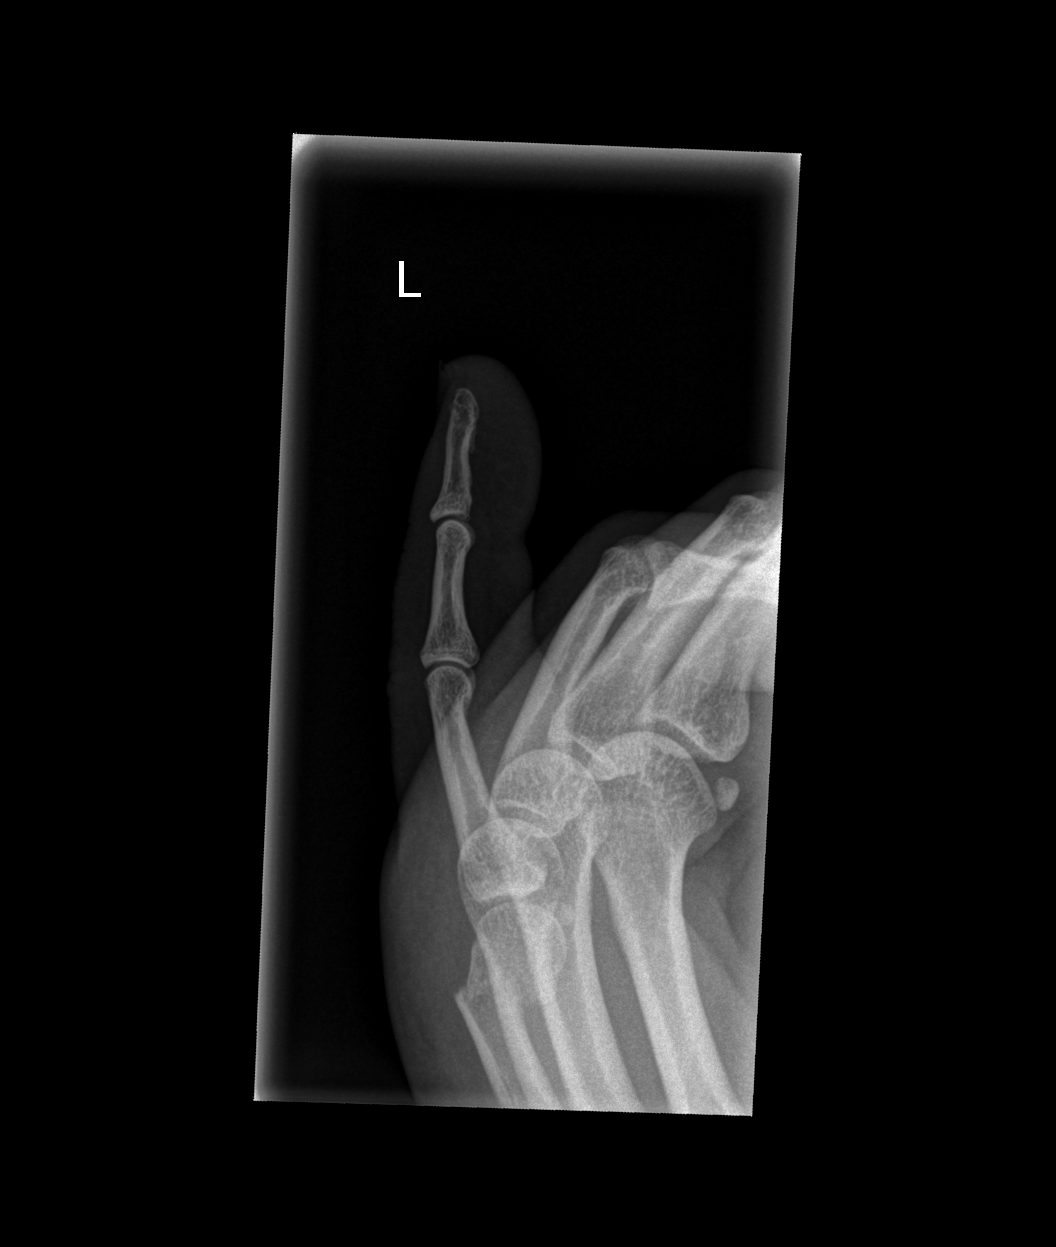

[4 of 4 positions shown; findings below may reference images not displayed]

FINDINGS: There an acute displaced and angulated fracture of the left fifth
metacarpal just proximal to the fifth MTP joint. Mild overlying soft
tissue swelling. No associated subluxation or dislocation. Fifth
metacarpal fracture does not involve the articular surface.
IMPRESSION: Acute minimally displaced and angulated left fifth distal metacarpal
fracture.

## 2020-02-09 ENCOUNTER — Ambulatory Visit
Admission: EM | Admit: 2020-02-09 | Discharge: 2020-02-09 | Disposition: A | Discharge status: Home / Self Care | Payer: 59 | Attending: Family Medicine | Admitting: Family Medicine

## 2020-02-09 ENCOUNTER — Other Ambulatory Visit: Payer: Self-pay

## 2020-02-09 DIAGNOSIS — K1120 Sialoadenitis, unspecified: Secondary | ICD-10-CM

## 2020-02-09 MED ORDER — CEPHALEXIN 500 MG PO CAPS: 500.0000 mg | ORAL_CAPSULE | Freq: Two times a day (BID) | ORAL | 0 refills | Status: AC

## 2020-02-09 NOTE — ED Triage Notes (Signed)
Pt states he has had a sore throat since Friday and that beginning today his ear hurts when he swallows as well. Pt is aox4 and ambulatory.

## 2020-02-09 NOTE — Discharge Instructions (Signed)
Please use ibuprofen as needed  Please use sour drops  Please use the antibiotics  Please follow up or be seen emergency department if your symptoms worsen or fail to improve.

## 2020-02-09 NOTE — ED Provider Notes (Signed)
[facility]    CSN: [id number] Arrival date & time: 02/09/20  1546      History   Chief Complaint Chief Complaint  Patient presents with  . Sore Throat    Only when swallowing    HPI Corey Snow is a 39 y.o. male.   He is presenting with right lower mandible pain.  Symptoms been ongoing for about a week.  Seems to get progressively worse.  He has a history of poor dentition.  Having pain only with swallowing.  No fevers or exposure to anyone with similar symptoms.  Having chills and night sweats.  HPI  History reviewed. No pertinent past medical history.  There are no problems to display for this patient.   Past Surgical History:  Procedure Laterality Date  . OPEN REDUCTION INTERNAL FIXATION (ORIF) METACARPAL Left 01/27/2017   Procedure: OPEN REDUCTION INTERNAL FIXATION (ORIF) LEFT SMALL  METACARPAL;  Surgeon: Dairl Ponder, MD;  Location: [facility];  Service: Orthopedics;  Laterality: Left;       Home Medications    Prior to Admission medications   Medication Sig Start Date End Date Taking? Authorizing Provider  cephALEXin (KEFLEX) 500 MG capsule Take 1 capsule (500 mg total) by mouth 2 (two) times daily. 02/09/20   Myra Rude, MD  ibuprofen (ADVIL,MOTRIN) 200 MG tablet Take 200 mg by mouth every 6 (six) hours as needed.    [provider]  vitamin C (ASCORBIC ACID) 500 MG tablet Take 500 mg by mouth daily.    [provider]    Family History History reviewed. No pertinent family history.  Social History Social History   Tobacco Use  . Smoking status: Current Every Day Smoker    Packs/day: 0.25    Types: Cigarettes  . Smokeless tobacco: Never Used  . Tobacco comment: Smokes 1/4 of pack of cigarettes  Vaping Use  . Vaping Use: Never used  Substance Use Topics  . Alcohol use: Yes    Comment: Socially  . Drug use: Yes    Types: Marijuana    Comment: smoked last week     Allergies    Patient has no known allergies.   Review of Systems Review of Systems  The HPI  Physical Exam Triage Vital Signs ED Triage Vitals  Enc Vitals Group     BP 02/09/20 1640 (!) 134/95     Pulse Rate 02/09/20 1640 (!) 113     Resp 02/09/20 1640 20     Temp 02/09/20 1640 99.5 F (37.5 C)     Temp Source 02/09/20 1640 Oral     SpO2 02/09/20 1640 96 %     Weight --      Height --      Head Circumference --      Peak Flow --      Pain Score 02/09/20 1642 0     Pain Loc --      Pain Edu? --      Excl. in GC? --    No data found.  Updated Vital Signs BP (!) 134/95 (BP Location: Right Arm)   Pulse (!) 113   Temp 99.5 F (37.5 C) (Oral)   Resp 20   SpO2 96%   Visual Acuity Right Eye Distance:   Left Eye Distance:   Bilateral Distance:    Right Eye Near:   Left Eye Near:    Bilateral Near:     Physical Exam Gen: NAD, alert, cooperative with  exam, well-appearing ENT: normal lips, poor dentition, uvula was midline, enlarged submandibular gland with tenderness Eye: normal EOM, normal conjunctiva and lids Skin: no rashes, no areas of induration  Neuro: normal tone, normal sensation to touch Psych:  normal insight, alert and oriented    UC Treatments / Results  Labs (all labs ordered are listed, but only abnormal results are displayed) Labs Reviewed - No data to display  EKG   Radiology No results found.  Procedures Procedures (including critical care time)  Medications Ordered in UC Medications - No data to display  Initial Impression / Assessment and Plan / UC Course  I have reviewed the triage vital signs and the nursing notes.  Pertinent labs & imaging results that were available during my care of the patient were reviewed by me and considered in my medical decision making (see chart for details).     Mr. Corey Snow is a 39 year old male that is presenting with symptoms suggestive of sialadenitis.  Likely having some association with his poor dentition  as well.  No signs of retropharyngeal abscess.  Provided Keflex.  Counseled supportive care.  Given indications on seeking immediate care.    Final Clinical Impressions(s) / UC Diagnoses   Final diagnoses:  Sialadenitis     Discharge Instructions     Please use ibuprofen as needed  Please use sour drops  Please use the antibiotics  Please follow up or be seen emergency department if your symptoms worsen or fail to improve.     ED Prescriptions    Medication Sig Dispense Auth. Provider   cephALEXin (KEFLEX) 500 MG capsule Take 1 capsule (500 mg total) by mouth 2 (two) times daily. 14 capsule Myra Rude, MD     PDMP not reviewed this encounter.   Myra Rude, MD 02/09/20 [phone]

## 2024-03-20 ENCOUNTER — Ambulatory Visit (INDEPENDENT_AMBULATORY_CARE_PROVIDER_SITE_OTHER): Payer: PRIVATE HEALTH INSURANCE

## 2024-03-20 ENCOUNTER — Encounter: Payer: Self-pay | Admitting: Emergency Medicine

## 2024-03-20 ENCOUNTER — Ambulatory Visit
Admission: EM | Admit: 2024-03-20 | Discharge: 2024-03-20 | Disposition: A | Discharge status: Home / Self Care | Payer: PRIVATE HEALTH INSURANCE

## 2024-03-20 DIAGNOSIS — J069 Acute upper respiratory infection, unspecified: Secondary | ICD-10-CM

## 2024-03-20 DIAGNOSIS — R051 Acute cough: Secondary | ICD-10-CM

## 2024-03-20 NOTE — ED Triage Notes (Signed)
 Pt reports severe productive cough and nasal congestion x6 days. Notes sternum pain with coughing episodes and hears a rattling in his chest. Taking alka selter cold/flu tabs with no relief. Denies fevers and chills.

## 2024-03-20 NOTE — Discharge Instructions (Addendum)
 No pneumonia seen on xray. Symptoms most likely due to viral illness, continue use of OTC cold meds.

## 2024-03-20 NOTE — ED Provider Notes (Addendum)
 " [facility]    CSN: [id number] Arrival date & time: 03/20/24  1220      History   Chief Complaint Chief Complaint  Patient presents with   Cough   Nasal Congestion    HPI SAMULE LIFE is a 43 y.o. male.   Pt presents today due 6 days of cough productive of clear sputum and nasal congestion. Pt states that he was in contact with sick grandson. Pt states that he started taking Alka Seltzer cold yesterday, is unable to appreciate relief with symptoms.   The history is provided by the patient.  Cough   History reviewed. No pertinent past medical history.  Patient Active Problem List   Diagnosis Date Noted   Closed displaced fracture of neck of left fifth metacarpal bone 01/31/2017   Decreased range of motion of finger of left hand 01/31/2017   Pain in left hand 01/31/2017    Past Surgical History:  Procedure Laterality Date   OPEN REDUCTION INTERNAL FIXATION (ORIF) METACARPAL Left 01/27/2017   Procedure: OPEN REDUCTION INTERNAL FIXATION (ORIF) LEFT SMALL  METACARPAL;  Surgeon: Sissy Cough, MD;  Location: [facility];  Service: Orthopedics;  Laterality: Left;       Home Medications    Prior to Admission medications  Medication Sig Start Date End Date Taking? Authorizing Provider  cephALEXin  (KEFLEX ) 500 MG capsule Take 1 capsule (500 mg total) by mouth 2 (two) times daily. Patient not taking: Reported on 03/20/2024 02/09/20   Schmitz, Jeremy E, MD  ibuprofen (ADVIL,MOTRIN) 200 MG tablet Take 200 mg by mouth every 6 (six) hours as needed. Patient not taking: Reported on 03/20/2024    [provider]  naproxen  (NAPROSYN ) 500 MG tablet Take by mouth. Patient not taking: Reported on 03/20/2024 01/21/17   [provider]  oxyCODONE -acetaminophen  (PERCOCET/ROXICET) 5-325 MG tablet Take 1 tablet by mouth every 6 (six) hours as needed. Patient not taking: Reported on 03/20/2024 01/27/17   [provider]  vitamin C  (ASCORBIC ACID) 500 MG tablet Take 500 mg by mouth daily. Patient not taking: Reported on 03/20/2024    [provider]    Family History History reviewed. No pertinent family history.  Social History Social History[1]   Allergies   Patient has no known allergies.   Review of Systems Review of Systems  Respiratory:  Positive for cough.      Physical Exam Triage Vital Signs ED Triage Vitals  Encounter Vitals Group     BP 03/20/24 1248 (!) 142/101     Girls Systolic BP Percentile --      Girls Diastolic BP Percentile --      Boys Systolic BP Percentile --      Boys Diastolic BP Percentile --      Pulse Rate 03/20/24 1248 87     Resp 03/20/24 1248 18     Temp 03/20/24 1248 98 F (36.7 C)     Temp Source 03/20/24 1248 Oral     SpO2 03/20/24 1248 94 %     Weight --      Height --      Head Circumference --      Peak Flow --      Pain Score 03/20/24 1249 0     Pain Loc --      Pain Education --      Exclude from Growth Chart --    No data found.  Updated Vital Signs BP (!) 142/101 (BP Location: Left  Arm)   Pulse 87   Temp 98 F (36.7 C) (Oral)   Resp 18   SpO2 94%   Visual Acuity Right Eye Distance:   Left Eye Distance:   Bilateral Distance:    Right Eye Near:   Left Eye Near:    Bilateral Near:     Physical Exam Vitals and nursing note reviewed.  Constitutional:      General: He is not in acute distress.    Appearance: Normal appearance. He is not ill-appearing, toxic-appearing or diaphoretic.  HENT:     Nose: Congestion (markedly enlarged turbinates) present. No rhinorrhea.  Eyes:     General: No scleral icterus. Cardiovascular:     Rate and Rhythm: Normal rate and regular rhythm.     Heart sounds: Normal heart sounds.  Pulmonary:     Effort: Pulmonary effort is normal. No respiratory distress.     Breath sounds: Normal breath sounds. No wheezing or rhonchi.  Skin:    General: Skin is warm.  Neurological:     Mental Status: He is  alert and oriented to person, place, and time.  Psychiatric:        Mood and Affect: Mood normal.        Behavior: Behavior normal.      UC Treatments / Results  Labs (all labs ordered are listed, but only abnormal results are displayed) Labs Reviewed - No data to display  EKG   Radiology No results found.  Procedures Procedures (including critical care time)  Medications Ordered in UC Medications - No data to display  Initial Impression / Assessment and Plan / UC Course  I have reviewed the triage vital signs and the nursing notes.  Pertinent labs & imaging results that were available during my care of the patient were reviewed by me and considered in my medical decision making (see chart for details).     Final Clinical Impressions(s) / UC Diagnoses   Final diagnoses:  None   Discharge Instructions   None    ED Prescriptions   None    PDMP not reviewed this encounter.    Andra Corean BROCKS, PA-C 03/20/24 1510     [1]  Social History Tobacco Use   Smoking status: Every Day    Current packs/day: 0.25    Types: Cigarettes   Smokeless tobacco: Never   Tobacco comments:    Smokes 1/4 of pack of cigarettes  Vaping Use   Vaping status: Never Used  Substance Use Topics   Alcohol use: Yes    Comment: Socially   Drug use: Yes    Types: Marijuana    Comment: smoked last week     Andra Corean BROCKS, PA-C 03/20/24 1537  "
